# Patient Record
Sex: Male | Born: 1980 | Race: White | Hispanic: No | Marital: Single | Smoking: Never smoker
Health system: Southern US, Community
[De-identification: ages and names within clinical notes are randomized; demographics above are authoritative.]

## PROBLEM LIST (undated history)

## (undated) ENCOUNTER — Ambulatory Visit: Payer: Self-pay | Source: Home / Self Care

## (undated) DIAGNOSIS — I10 Essential (primary) hypertension: Secondary | ICD-10-CM

---

## 2007-08-09 ENCOUNTER — Ambulatory Visit: Payer: Self-pay | Admitting: Chiropractic Medicine

## 2019-05-30 ENCOUNTER — Ambulatory Visit: Payer: Managed Care, Other (non HMO) | Attending: Internal Medicine

## 2019-05-30 DIAGNOSIS — Z20822 Contact with and (suspected) exposure to covid-19: Secondary | ICD-10-CM

## 2019-05-31 LAB — NOVEL CORONAVIRUS, NAA: SARS-CoV-2, NAA: NOT DETECTED

## 2019-06-17 ENCOUNTER — Other Ambulatory Visit: Payer: Self-pay | Admitting: Orthopedic Surgery

## 2019-06-17 DIAGNOSIS — G8929 Other chronic pain: Secondary | ICD-10-CM

## 2019-06-17 DIAGNOSIS — M545 Low back pain, unspecified: Secondary | ICD-10-CM

## 2019-06-27 ENCOUNTER — Ambulatory Visit
Admission: RE | Admit: 2019-06-27 | Discharge: 2019-06-27 | Disposition: A | Discharge status: Home / Self Care | Payer: Managed Care, Other (non HMO) | Source: Ambulatory Visit | Attending: Orthopedic Surgery | Admitting: Orthopedic Surgery

## 2019-06-27 ENCOUNTER — Other Ambulatory Visit: Payer: Self-pay

## 2019-06-27 DIAGNOSIS — G8929 Other chronic pain: Secondary | ICD-10-CM

## 2019-06-27 DIAGNOSIS — M545 Low back pain, unspecified: Secondary | ICD-10-CM

## 2020-10-06 IMAGING — CT CT BIOPSY
1 of 5 series · 10 of 32 positions shown, 16 images · non-contrast
Comparison: none

CLINICAL DATA: Chronic low back pain. Left buttock and lower
extremity pain. Suspected left sacroiliac joint dysfunction.

[Series 2: needle -guided injection · axial · 0.73mm/px · z∈[-111,-19]mm · 10 of 58 slices shown, 16 images]
[im 6/58  soft-tissue]
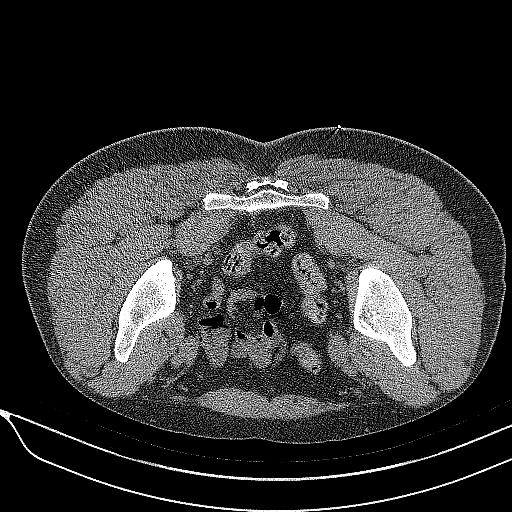
[im 6/58  bone]
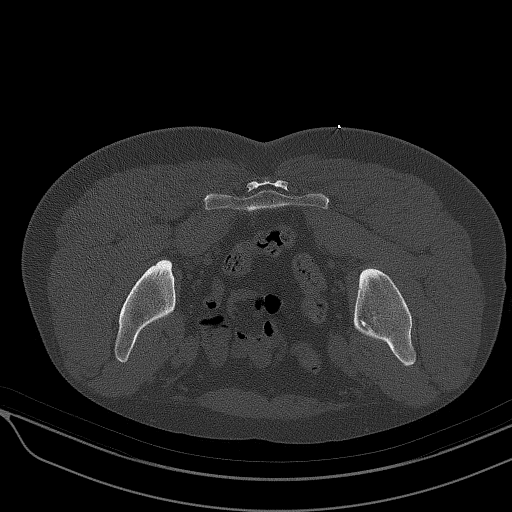
[im 11/58  soft-tissue]
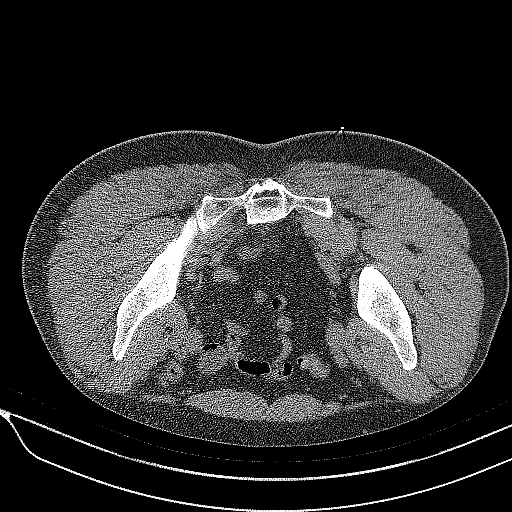
[im 16/58  soft-tissue]
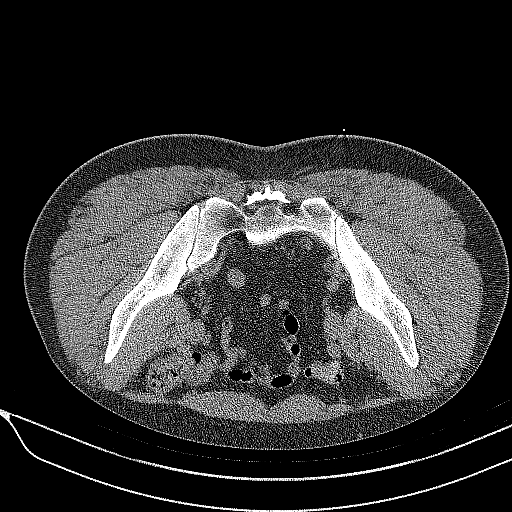
[im 21/58  soft-tissue]
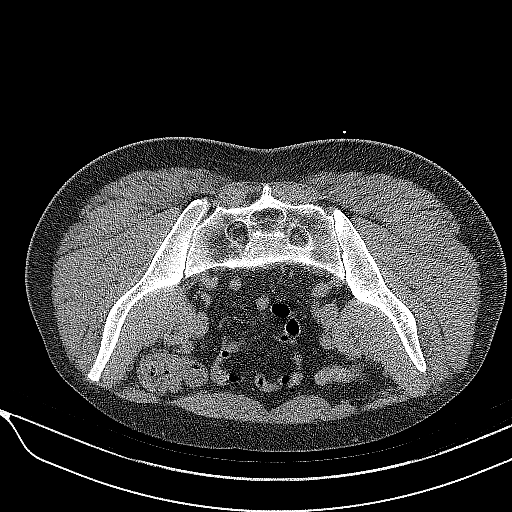
[im 26/58  soft-tissue]
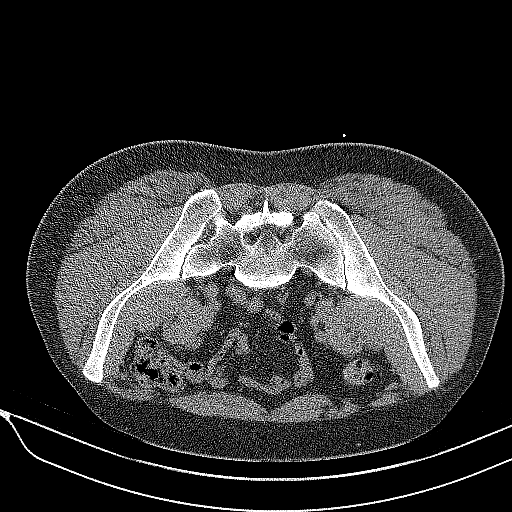
[im 32/58  soft-tissue]
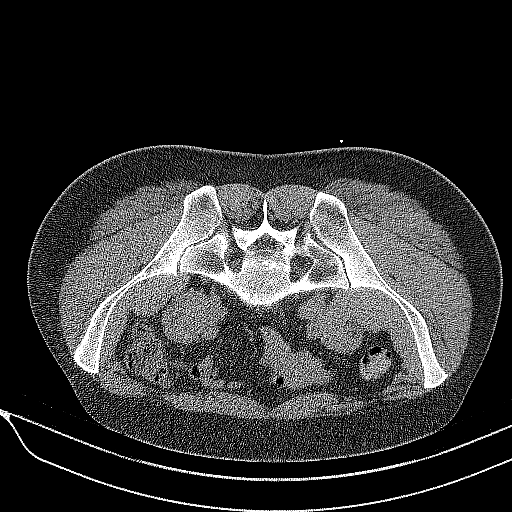
[im 37/58  soft-tissue]
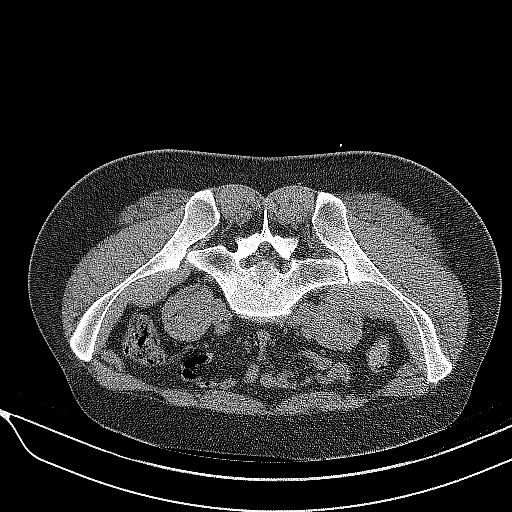
[im 37/58  lung]
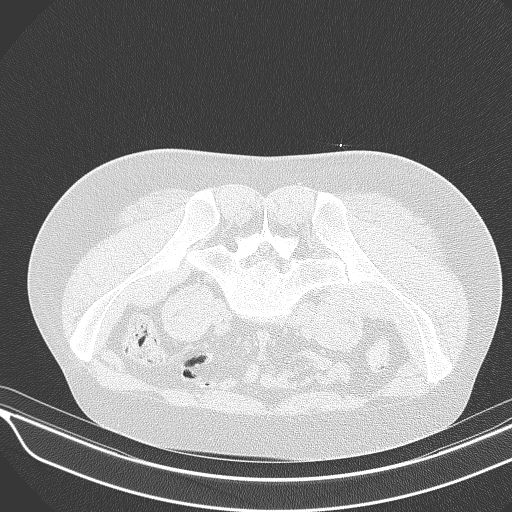
[im 42/58  soft-tissue]
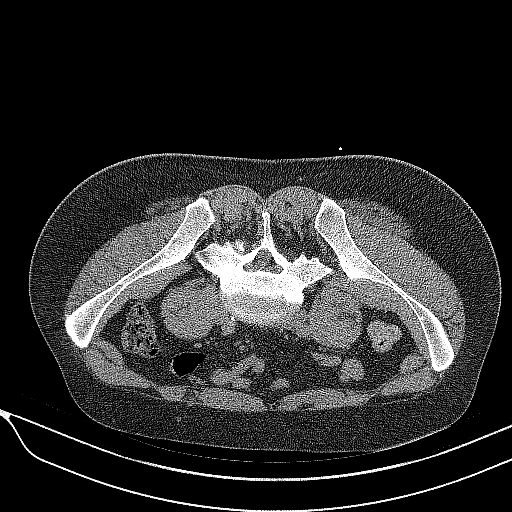
[im 42/58  lung]
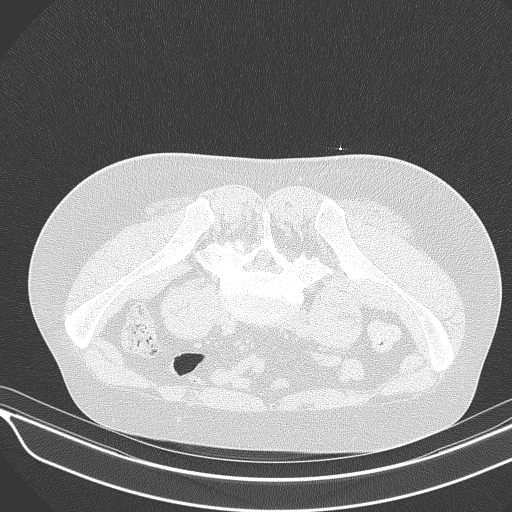
[im 47/58  soft-tissue]
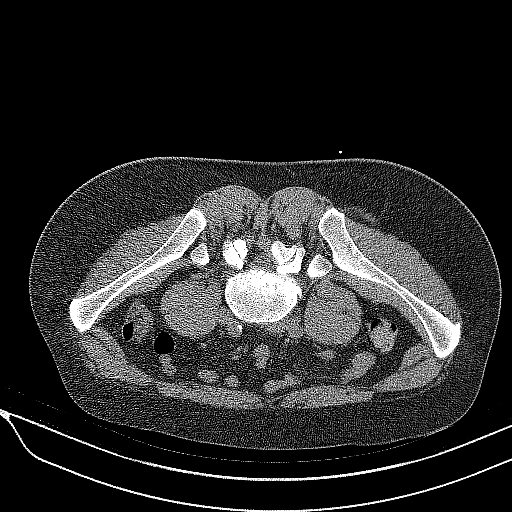
[im 47/58  lung]
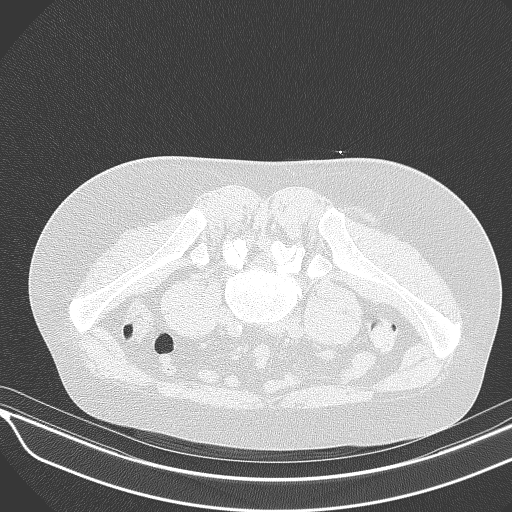
[im 47/58  bone]
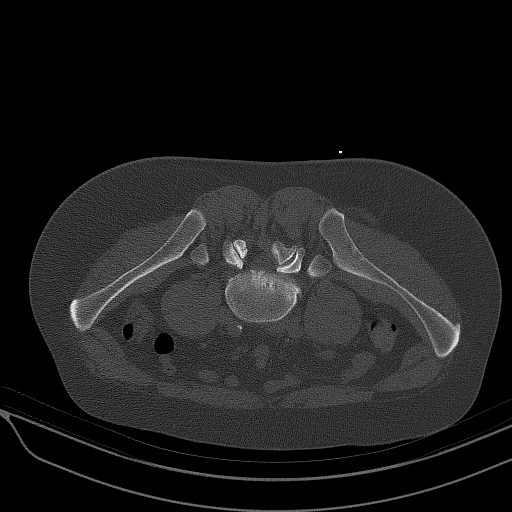
[im 52/58  soft-tissue]
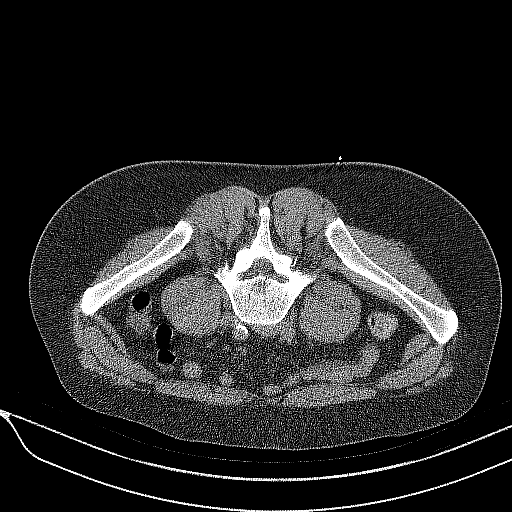
[im 52/58  lung]
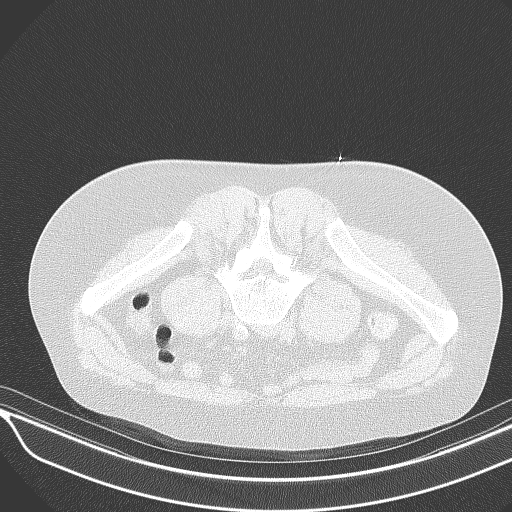

[10 of 32 positions shown; findings below may reference images not displayed]

EXAM:
CT GUIDED LEFT SI JOINT INJECTION

PROCEDURE:
After a thorough discussion of risks and benefits of the procedure,
including bleeding, infection, injury to nerves, blood vessels, and
adjacent structures, verbal and written consent was obtained. The
patient was placed prone on the CT table and localization was
performed over the sacrum. Target site marked using CT guidance. The
skin was prepped and draped in the usual sterile fashion using
Betadine soap.

After local anesthesia with 1% lidocaine without epinephrine and
subsequent deep anesthesia, a 3.5 inch 22 gauge spinal needle was
advanced into the left SI joint under intermittent CT guidance.

Injection of a small amount of Isovue-M 200 demonstrated
intra-articular contrast spread. Subsequently, 2 mL of 0.5%
bupivacaine were injected into the left SI joint. The needle was
removed and a sterile dressing applied.

No complications were observed.
IMPRESSION: Successful CT-guided left SI joint injection.

## 2021-02-14 ENCOUNTER — Ambulatory Visit
Admission: EM | Admit: 2021-02-14 | Discharge: 2021-02-14 | Disposition: A | Discharge status: Home / Self Care | Payer: Managed Care, Other (non HMO)

## 2021-02-14 ENCOUNTER — Other Ambulatory Visit: Payer: Self-pay

## 2021-02-14 DIAGNOSIS — Z1152 Encounter for screening for COVID-19: Secondary | ICD-10-CM | POA: Diagnosis not present

## 2021-02-14 DIAGNOSIS — Z20828 Contact with and (suspected) exposure to other viral communicable diseases: Secondary | ICD-10-CM

## 2021-02-14 HISTORY — DX: Essential (primary) hypertension: I10

## 2021-02-14 MED ORDER — OSELTAMIVIR PHOSPHATE 75 MG PO CAPS: 75.0000 mg | ORAL_CAPSULE | Freq: Two times a day (BID) | ORAL | 0 refills | Status: AC

## 2021-02-14 NOTE — ED Provider Notes (Signed)
[facility]    CSN: [id number] Arrival date & time: 02/14/21  1220      History   Chief Complaint Chief Complaint  Patient presents with   Generalized Body Aches    HPI Kalmen Lollar is a 40 y.o. male.   Patient here today for evaluation of body aches, low-grade fever, nasal congestion and cough that started yesterday.  He states that his son currently has a flu.  T-max was a little over 100 F.  He tried TheraFlu without significant relief.  He denies any nausea, vomiting, or diarrhea.   The history is provided by the patient.   Past Medical History:  Diagnosis Date   Hypertension     There are no problems to display for this patient.   History reviewed. No pertinent surgical history.     Home Medications    Prior to Admission medications   Medication Sig Start Date End Date Taking? Authorizing Provider  lisinopril (ZESTRIL) 40 MG tablet Take 40 mg by mouth daily.   Yes [provider]  oseltamivir (TAMIFLU) 75 MG capsule Take 1 capsule (75 mg total) by mouth every 12 (twelve) hours for 5 days. 02/14/21 02/19/21 Yes Tomi Bamberger, PA-C    Family History History reviewed. No pertinent family history.  Social History Social History   Tobacco Use   Smoking status: Never   Smokeless tobacco: Never  Vaping Use   Vaping Use: Never used  Substance Use Topics   Alcohol use: Never   Drug use: Never     Allergies   Patient has no known allergies.   Review of Systems Review of Systems  Constitutional:  Positive for chills and fever.  HENT:  Positive for congestion and sore throat. Negative for ear pain.   Eyes:  Negative for discharge and redness.  Respiratory:  Positive for cough. Negative for shortness of breath.   Gastrointestinal:  Negative for abdominal pain, nausea and vomiting.  Musculoskeletal:  Positive for myalgias.  Neurological:  Positive for headaches.    Physical Exam Triage Vital Signs ED Triage Vitals  Enc  Vitals Group     BP 02/14/21 1334 131/81     Pulse Rate 02/14/21 1334 79     Resp 02/14/21 1334 18     Temp 02/14/21 1334 99.5 F (37.5 C)     Temp Source 02/14/21 1334 Oral     SpO2 02/14/21 1334 97 %     Weight --      Height --      Head Circumference --      Peak Flow --      Pain Score 02/14/21 1331 9     Pain Loc --      Pain Edu? --      Excl. in GC? --    No data found.  Updated Vital Signs BP 131/81 (BP Location: Left Arm)   Pulse 79   Temp 99.5 F (37.5 C) (Oral)   Resp 18   SpO2 97%      Physical Exam Vitals and nursing note reviewed.  Constitutional:      General: He is not in acute distress.    Appearance: He is well-developed. He is not ill-appearing.  HENT:     Head: Normocephalic and atraumatic.     Left Ear: Tympanic membrane normal.     Nose: Congestion present.     Mouth/Throat:     Mouth: Mucous membranes are moist.     Pharynx: Posterior oropharyngeal  erythema present. No oropharyngeal exudate.     Tonsils: 0 on the right. 0 on the left.  Eyes:     Conjunctiva/sclera: Conjunctivae normal.  Cardiovascular:     Rate and Rhythm: Normal rate and regular rhythm.     Heart sounds: Normal heart sounds. No murmur heard. Pulmonary:     Effort: Pulmonary effort is normal. No respiratory distress.     Breath sounds: Normal breath sounds. No wheezing, rhonchi or rales.  Skin:    General: Skin is warm and dry.  Neurological:     Mental Status: He is alert.  Psychiatric:        Mood and Affect: Mood normal.        Behavior: Behavior normal.     UC Treatments / Results  Labs (all labs ordered are listed, but only abnormal results are displayed) Labs Reviewed  COVID-19, FLU A+B NAA    EKG   Radiology No results found.  Procedures Procedures (including critical care time)  Medications Ordered in UC Medications - No data to display  Initial Impression / Assessment and Plan / UC Course  I have reviewed the triage vital signs and the  nursing notes.  Pertinent labs & imaging results that were available during my care of the patient were reviewed by me and considered in my medical decision making (see chart for details).  Will screen for COVID and flu but suspect likely flu given known exposure.  Will treat with Tamiflu while awaiting results.  Encouraged follow-up if symptoms fail to improve or worsen anyway.  Final Clinical Impressions(s) / UC Diagnoses   Final diagnoses:  Encounter for screening for COVID-19  Exposure to influenza   Discharge Instructions   None    ED Prescriptions     Medication Sig Dispense Auth. Provider   oseltamivir (TAMIFLU) 75 MG capsule Take 1 capsule (75 mg total) by mouth every 12 (twelve) hours for 5 days. 10 capsule Tomi Bamberger, PA-C      PDMP not reviewed this encounter.   Tomi Bamberger, PA-C 02/14/21 1450

## 2021-02-14 NOTE — ED Triage Notes (Signed)
Pt presents with cough, fatigue, HA, body aches x 1 day.  States son currently has flu.  Took Theraflu last night with no relief.

## 2021-02-15 LAB — COVID-19, FLU A+B NAA
Influenza A, NAA: DETECTED — AB
Influenza B, NAA: NOT DETECTED
SARS-CoV-2, NAA: NOT DETECTED

## 2022-04-14 ENCOUNTER — Ambulatory Visit
Admission: RE | Admit: 2022-04-14 | Discharge: 2022-04-14 | Disposition: A | Discharge status: Home / Self Care | Payer: Managed Care, Other (non HMO) | Source: Ambulatory Visit | Attending: Urgent Care | Admitting: Urgent Care

## 2022-04-14 VITALS — BP 126/80 | HR 70 | Temp 99.4°F | Resp 16

## 2022-04-14 DIAGNOSIS — Z1152 Encounter for screening for COVID-19: Secondary | ICD-10-CM | POA: Insufficient documentation

## 2022-04-14 DIAGNOSIS — J069 Acute upper respiratory infection, unspecified: Secondary | ICD-10-CM | POA: Insufficient documentation

## 2022-04-14 DIAGNOSIS — B349 Viral infection, unspecified: Secondary | ICD-10-CM

## 2022-04-14 DIAGNOSIS — Z79899 Other long term (current) drug therapy: Secondary | ICD-10-CM | POA: Diagnosis not present

## 2022-04-14 LAB — POCT INFLUENZA A/B
Influenza A, POC: NEGATIVE
Influenza B, POC: NEGATIVE

## 2022-04-14 LAB — RESP PANEL BY RT-PCR (FLU A&B, COVID) ARPGX2
Influenza A by PCR: NEGATIVE
Influenza B by PCR: NEGATIVE
SARS Coronavirus 2 by RT PCR: NEGATIVE

## 2022-04-14 MED ORDER — OSELTAMIVIR PHOSPHATE 75 MG PO CAPS: 75.0000 mg | ORAL_CAPSULE | Freq: Two times a day (BID) | ORAL | 0 refills | Status: DC

## 2022-04-14 NOTE — ED Triage Notes (Signed)
Pt states he woke this am with body aches, sore throat-denies sore throat at present-denies cough and URI sx-NAD-steady gait

## 2022-04-14 NOTE — Discharge Instructions (Addendum)

## 2022-04-14 NOTE — ED Provider Notes (Signed)
[facility]  Note:  This document was prepared using Conservation officer, historic buildings and may include unintentional dictation errors.  MRN: [id number] DOB: 20-Nov-1980  Subjective:   Jeffrey Mckenzie is a 41 y.o. male presenting for acute onset this morning of malaise including body aches, throat pain.  The throat pain has improved but wants to make sure he gets checked for flu and COVID.  No cough, chest pain, shortness of breath or wheezing.  No exposures that he knows of including strep.  No current facility-administered medications for this encounter.  Current Outpatient Medications:    lisinopril (ZESTRIL) 40 MG tablet, Take 40 mg by mouth daily., Disp: , Rfl:    No Known Allergies  Past Medical History:  Diagnosis Date   Hypertension      History reviewed. No pertinent surgical history.  No family history on file.  Social History   Tobacco Use   Smoking status: Never   Smokeless tobacco: Never  Vaping Use   Vaping Use: Never used  Substance Use Topics   Alcohol use: Yes    Comment: rare   Drug use: Never    ROS   Objective:   Vitals: BP 126/80 (BP Location: Right Arm)   Pulse 70   Temp 99.4 F (37.4 C) (Oral)   Resp 16   SpO2 98%   Physical Exam Constitutional:      General: He is not in acute distress.    Appearance: Normal appearance. He is well-developed and normal weight. He is not ill-appearing, toxic-appearing or diaphoretic.  HENT:     Head: Normocephalic and atraumatic.     Right Ear: External ear normal.     Left Ear: External ear normal.     Nose: Nose normal.     Mouth/Throat:     Pharynx: Oropharynx is clear. No pharyngeal swelling, oropharyngeal exudate, posterior oropharyngeal erythema or uvula swelling.     Tonsils: No tonsillar exudate or tonsillar abscesses. 0 on the right. 0 on the left.  Eyes:     General: No scleral icterus.       Right eye: No discharge.        Left eye: No discharge.      Extraocular Movements: Extraocular movements intact.  Cardiovascular:     Rate and Rhythm: Normal rate.  Pulmonary:     Effort: Pulmonary effort is normal.  Musculoskeletal:     Cervical back: Normal range of motion.  Neurological:     Mental Status: He is alert and oriented to person, place, and time.  Psychiatric:        Mood and Affect: Mood normal.        Behavior: Behavior normal.        Thought Content: Thought content normal.        Judgment: Judgment normal.     Results for orders placed or performed during the hospital encounter of 04/14/22 (from the past 24 hour(s))  POCT Influenza A/B     Status: None   Collection Time: 04/14/22  4:58 PM  Result Value Ref Range   Influenza A, POC Negative Negative   Influenza B, POC Negative Negative    Assessment and Plan :   PDMP not reviewed this encounter.  1. Acute viral syndrome     Does not meet [facility] criteria for strep testing.  COVID and flu test pending.  We will otherwise manage for viral upper respiratory infection.  Physical exam findings reassuring and vital signs stable  for discharge. Advised supportive care, offered symptomatic relief. Counseled patient on potential for adverse effects with medications prescribed/recommended today, ER and return-to-clinic precautions discussed, patient verbalized understanding.    At patient's request, he would like to start Tamiflu and I was agreeable.  If this testing is negative tomorrow recommended stopping the Tamiflu.  Patient would be a good candidate to take Paxlovid should he test positive for COVID.  Deferred blood work to confirm his GFR, has no history of CKD.   Wallis Bamberg, PA-C 04/14/22 1719

## 2023-06-12 ENCOUNTER — Ambulatory Visit
Admission: EM | Admit: 2023-06-12 | Discharge: 2023-06-12 | Disposition: A | Discharge status: Home / Self Care | Payer: BLUE CROSS/BLUE SHIELD | Attending: Family Medicine | Admitting: Family Medicine

## 2023-06-12 DIAGNOSIS — J111 Influenza due to unidentified influenza virus with other respiratory manifestations: Secondary | ICD-10-CM | POA: Diagnosis not present

## 2023-06-12 MED ORDER — PSEUDOEPHEDRINE HCL 60 MG PO TABS: 60.0000 mg | ORAL_TABLET | Freq: Three times a day (TID) | ORAL | 0 refills | Status: AC | PRN

## 2023-06-12 MED ORDER — OSELTAMIVIR PHOSPHATE 75 MG PO CAPS: 75.0000 mg | ORAL_CAPSULE | Freq: Two times a day (BID) | ORAL | 0 refills | Status: AC

## 2023-06-12 MED ORDER — CETIRIZINE HCL 10 MG PO TABS: 10.0000 mg | ORAL_TABLET | Freq: Every day | ORAL | 0 refills | Status: AC

## 2023-06-12 MED ORDER — PROMETHAZINE-DM 6.25-15 MG/5ML PO SYRP: 5.0000 mL | ORAL_SOLUTION | Freq: Three times a day (TID) | ORAL | 0 refills | Status: AC | PRN

## 2023-06-12 MED ORDER — IBUPROFEN 600 MG PO TABS: 600.0000 mg | ORAL_TABLET | Freq: Four times a day (QID) | ORAL | 0 refills | Status: AC | PRN

## 2023-06-12 NOTE — ED Triage Notes (Signed)
 Pt reports body aches and headache since this morning. States his son has Flu.

## 2023-06-12 NOTE — ED Provider Notes (Signed)
  [facility]  Note:  This document was prepared using Conservation officer, historic buildings and may include unintentional dictation errors.  MRN: [id number] DOB: 03-28-1981  Subjective:   Jeffrey Mckenzie is a 43 y.o. male presenting for 1 day history of fever, body aches, malaise, fatigue, headaches, coughing. His son tested positive for influenza. No asthma. No smoking of any kind including cigarettes, cigars, vaping, marijuana use.    No current facility-administered medications for this encounter.  Current Outpatient Medications:    lisinopril (ZESTRIL) 40 MG tablet, Take 40 mg by mouth daily., Disp: , Rfl:    oseltamivir  (TAMIFLU ) 75 MG capsule, Take 1 capsule (75 mg total) by mouth 2 (two) times daily., Disp: 10 capsule, Rfl: 0   No Known Allergies  Past Medical History:  Diagnosis Date   Hypertension      History reviewed. No pertinent surgical history.  History reviewed. No pertinent family history.  Social History   Tobacco Use   Smoking status: Never   Smokeless tobacco: Never  Vaping Use   Vaping status: Never Used  Substance Use Topics   Alcohol use: Yes    Comment: rare   Drug use: Never    ROS   Objective:   Vitals: BP 137/83 (BP Location: Right Arm)   Pulse 69   Temp (!) 101 F (38.3 C) (Oral)   Resp 18   SpO2 97%   Physical Exam Constitutional:      General: He is not in acute distress.    Appearance: Normal appearance. He is well-developed. He is not ill-appearing, toxic-appearing or diaphoretic.  HENT:     Head: Normocephalic and atraumatic.     Right Ear: External ear normal.     Left Ear: External ear normal.     Nose: Nose normal.     Mouth/Throat:     Mouth: Mucous membranes are moist.  Eyes:     General: No scleral icterus.       Right eye: No discharge.        Left eye: No discharge.     Extraocular Movements: Extraocular movements intact.  Cardiovascular:     Rate and Rhythm: Normal rate and regular  rhythm.     Heart sounds: Normal heart sounds. No murmur heard.    No friction rub. No gallop.  Pulmonary:     Effort: Pulmonary effort is normal. No respiratory distress.     Breath sounds: Normal breath sounds. No stridor. No wheezing, rhonchi or rales.  Neurological:     Mental Status: He is alert and oriented to person, place, and time.  Psychiatric:        Mood and Affect: Mood normal.        Behavior: Behavior normal.        Thought Content: Thought content normal.     Assessment and Plan :   PDMP not reviewed this encounter.  1. Influenza    Deferred imaging given clear cardiopulmonary exam, hemodynamically stable vital signs. Will cover for influenza with Tamiflu  given exposure, symptom set, current incidence in the community.  Use supportive care, rest, fluids, hydration, light meals, schedule Tylenol and ibuprofen . Counseled patient on potential for adverse effects with medications prescribed today, patient verbalized understanding. ER and return-to-clinic precautions discussed, patient verbalized understanding.    Adolph Hoop, [place] 06/12/23 4098

## 2023-06-12 NOTE — ED Notes (Signed)
 Tylenol offered for fever, pt declined, states "I have Tylenol at home".

## 2023-06-12 NOTE — Discharge Instructions (Addendum)
 We will manage this for influenza with Tamiflu . For sore throat or cough try using a honey-based tea. Use 3 teaspoons of honey with juice squeezed from half lemon. Place shaved pieces of ginger into 1/2-1 cup of water and warm over stove top. Then mix the ingredients and repeat every 4 hours as needed. Please take ibuprofen  600mg  every 6 hours with food alternating with OR taken together with Tylenol 500mg -650mg  every 6 hours for throat pain, fevers, aches and pains. Hydrate very well with at least 2 liters of water. Eat light meals such as soups (chicken and noodles, vegetable, chicken and wild rice).  Do not eat foods that you are allergic to.  Taking an antihistamine like Zyrtec  (10mg  daily) can help against postnasal drainage, sinus congestion which can cause sinus pain, sinus headaches, throat pain, painful swallowing, coughing.  You can take this together with pseudoephedrine  (Sudafed) at a dose of 60mg  3 times a day or twice daily as needed for the same kind of nasal drip, congestion.  Use cough syrup as needed.
